# Patient Record
Sex: Female | Born: 1970 | Race: White | Hispanic: Yes | Marital: Married | Smoking: Never smoker
Health system: Southern US, Community
[De-identification: ages and names within clinical notes are randomized; demographics above are authoritative.]

## PROBLEM LIST (undated history)

## (undated) DIAGNOSIS — F32A Depression, unspecified: Secondary | ICD-10-CM

## (undated) DIAGNOSIS — F329 Major depressive disorder, single episode, unspecified: Secondary | ICD-10-CM

## (undated) HISTORY — DX: Depression, unspecified: F32.A

## (undated) HISTORY — PX: BREAST SURGERY: SHX581

## (undated) HISTORY — DX: Major depressive disorder, single episode, unspecified: F32.9

---

## 2006-09-19 ENCOUNTER — Emergency Department (HOSPITAL_COMMUNITY): Admission: EM | Admit: 2006-09-19 | Discharge: 2006-09-20 | Payer: Self-pay | Admitting: Emergency Medicine

## 2006-09-20 ENCOUNTER — Ambulatory Visit (HOSPITAL_COMMUNITY): Admission: RE | Admit: 2006-09-20 | Discharge: 2006-09-20 | Payer: Self-pay | Admitting: Emergency Medicine

## 2006-09-20 ENCOUNTER — Encounter (INDEPENDENT_AMBULATORY_CARE_PROVIDER_SITE_OTHER): Payer: Self-pay | Admitting: Emergency Medicine

## 2006-09-20 ENCOUNTER — Ambulatory Visit: Payer: Self-pay | Admitting: Vascular Surgery

## 2007-04-04 ENCOUNTER — Inpatient Hospital Stay (HOSPITAL_COMMUNITY): Admission: AD | Admit: 2007-04-04 | Discharge: 2007-04-04 | Payer: Self-pay | Admitting: Obstetrics & Gynecology

## 2007-05-06 ENCOUNTER — Ambulatory Visit (HOSPITAL_COMMUNITY): Admission: RE | Admit: 2007-05-06 | Discharge: 2007-05-06 | Payer: Self-pay | Admitting: Obstetrics & Gynecology

## 2007-06-11 ENCOUNTER — Ambulatory Visit (HOSPITAL_COMMUNITY): Admission: RE | Admit: 2007-06-11 | Discharge: 2007-06-11 | Payer: Self-pay | Admitting: Family Medicine

## 2007-07-22 ENCOUNTER — Ambulatory Visit: Payer: Self-pay | Admitting: Obstetrics and Gynecology

## 2007-07-22 ENCOUNTER — Inpatient Hospital Stay (HOSPITAL_COMMUNITY): Admission: AD | Admit: 2007-07-22 | Discharge: 2007-07-24 | Payer: Self-pay | Admitting: Obstetrics & Gynecology

## 2007-07-22 ENCOUNTER — Encounter (INDEPENDENT_AMBULATORY_CARE_PROVIDER_SITE_OTHER): Payer: Self-pay | Admitting: Gynecology

## 2007-07-30 ENCOUNTER — Ambulatory Visit: Payer: Self-pay | Admitting: Advanced Practice Midwife

## 2007-07-30 ENCOUNTER — Inpatient Hospital Stay (HOSPITAL_COMMUNITY): Admission: AD | Admit: 2007-07-30 | Discharge: 2007-07-30 | Payer: Self-pay | Admitting: Obstetrics & Gynecology

## 2008-11-25 IMAGING — US US PELVIS COMPLETE
1 series · 14 of 20 positions shown · non-contrast
Comparison: None available

CLINICAL DATA: POSTPARTUM PAIN

TRANSABDOMINAL ULTRASOUND OF PELVIS
TECHNIQUE: Transabdominal ultrasound examination of the pelvis was
performed including evaluation of the uterus, ovaries, adnexal
regions, and pelvic cul-de-sac.

[Series 1: us pelvis complete · 0.28mm/px · 20 acquisitions, 14 frames shown]
[im 1/20]
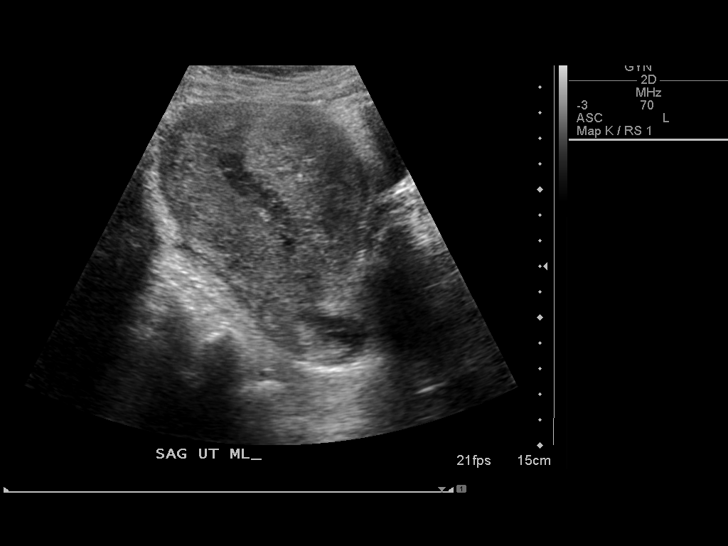
[im 3/20]
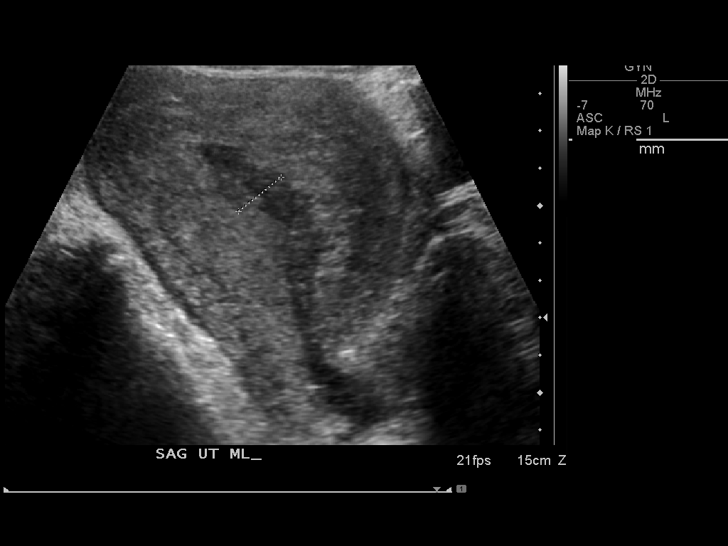
[im 4/20]
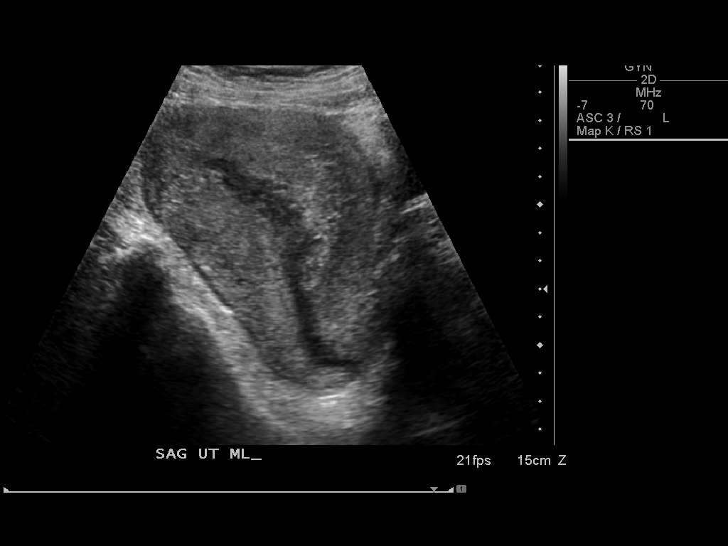
[im 6/20]
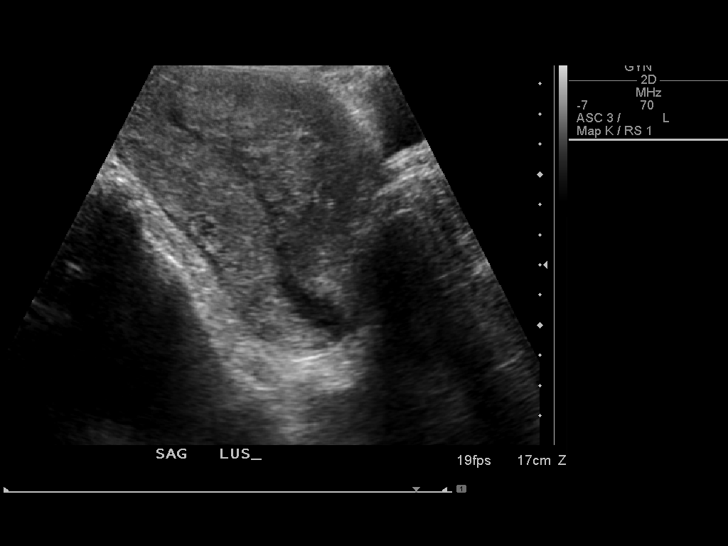
[im 7/20]
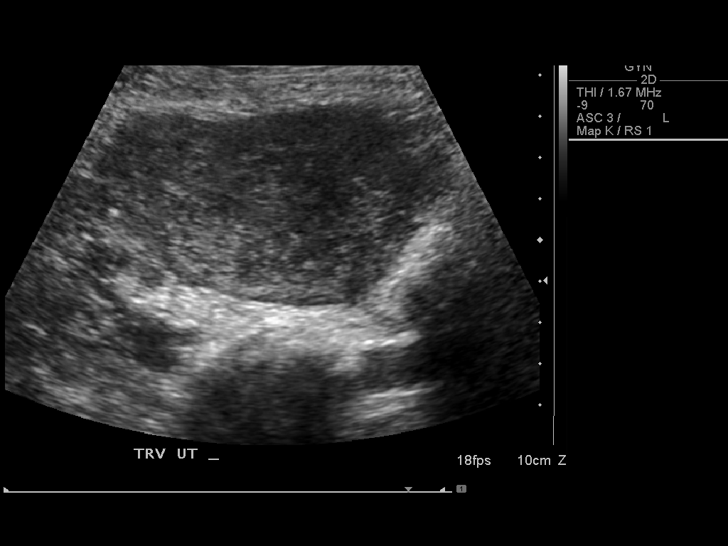
[im 8/20]
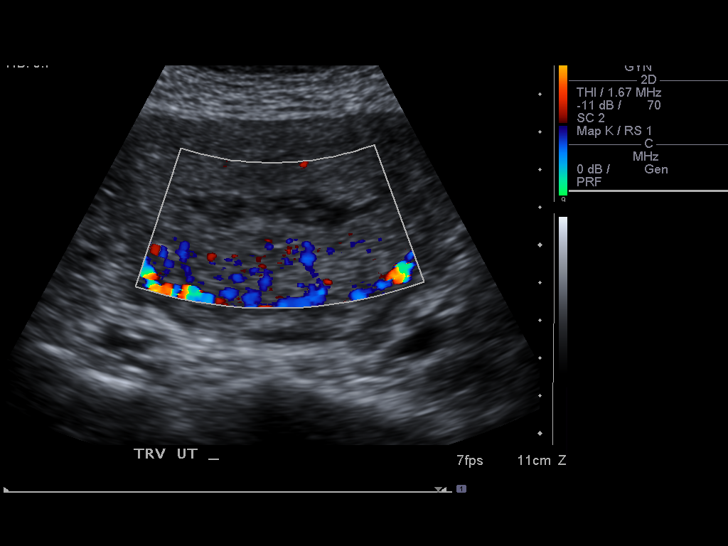
[im 10/20]
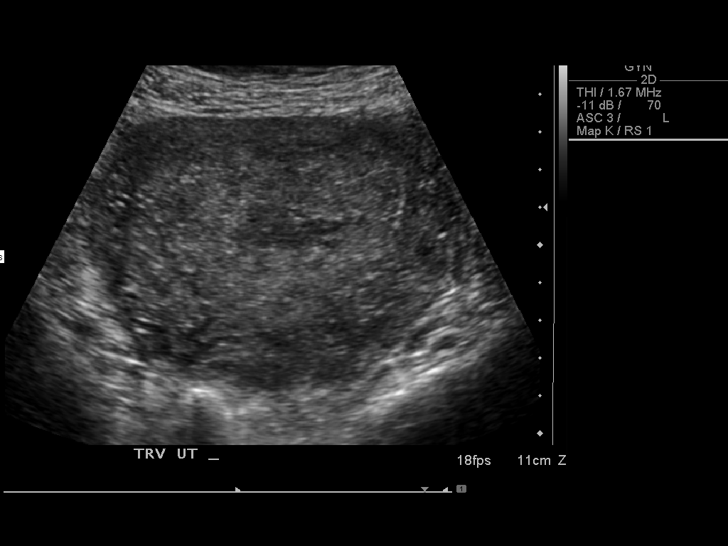
[im 11/20]
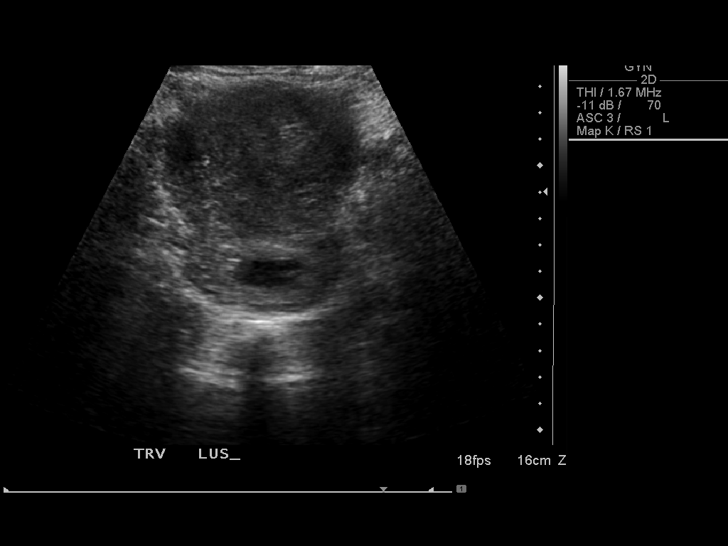
[im 13/20]
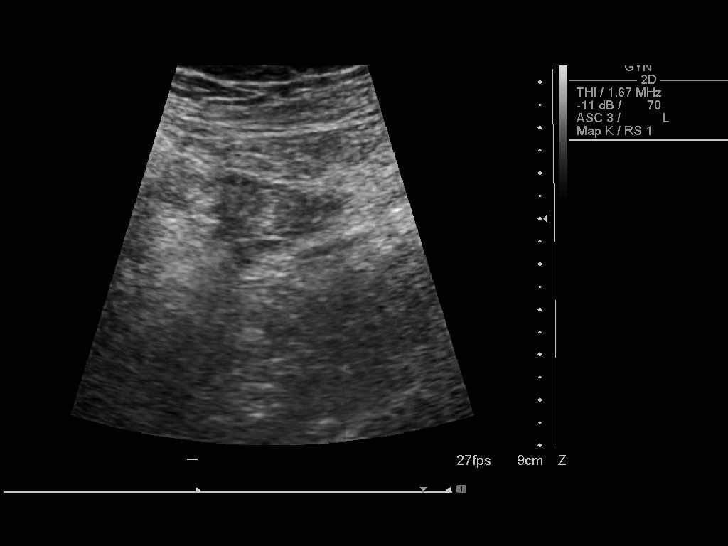
[im 14/20]
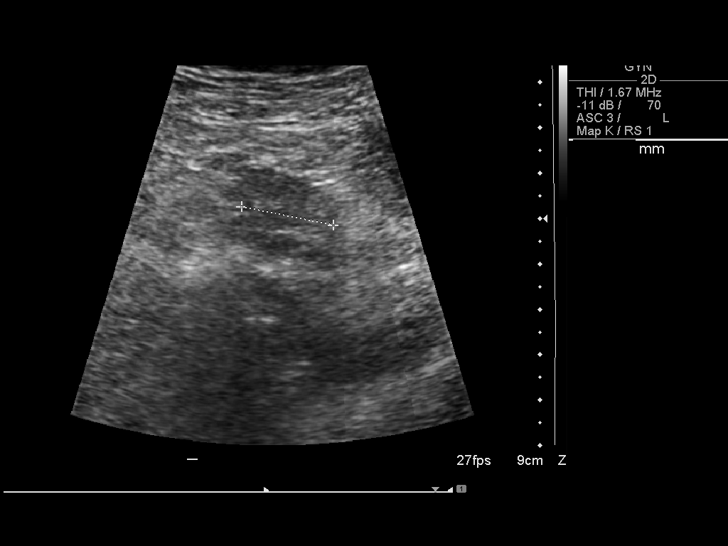
[im 16/20]
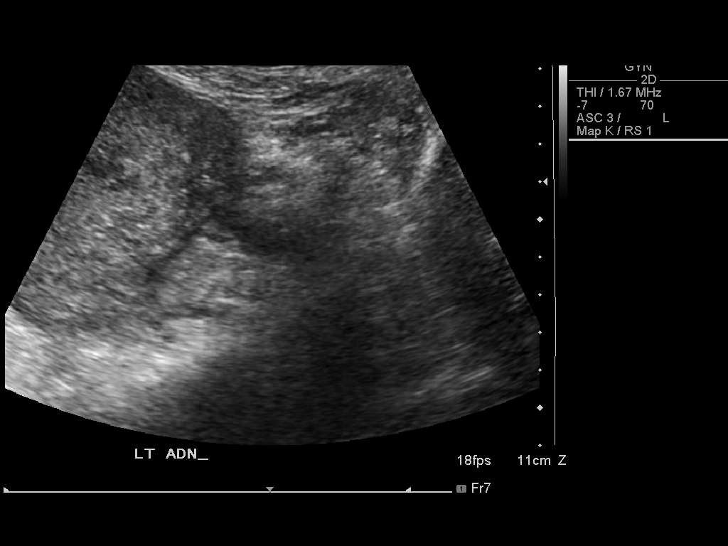
[im 17/20]
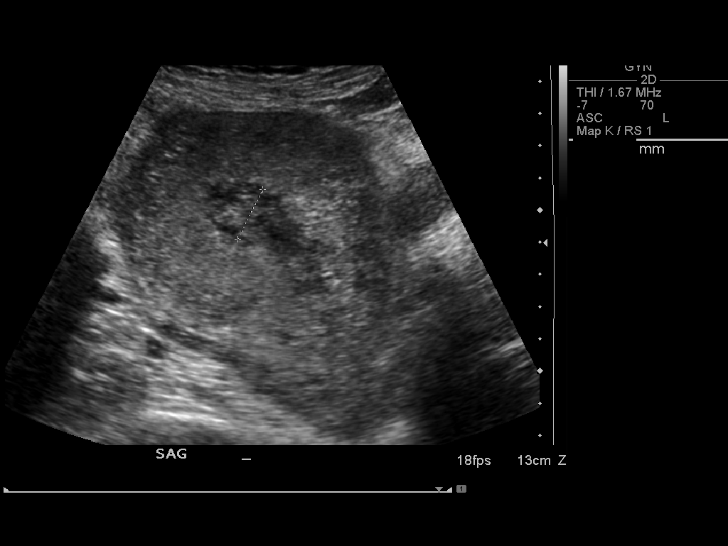
[im 18/20]
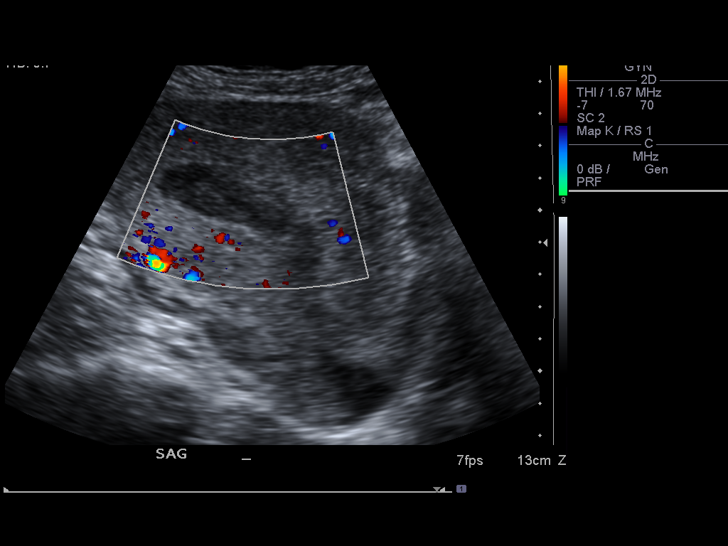
[im 20/20]
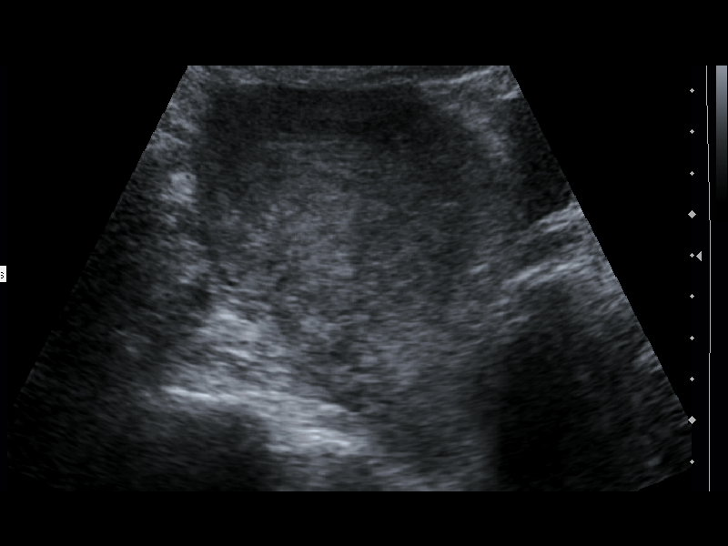

[14 of 20 positions shown; findings below may reference images not displayed]

FINDINGS: Uterus is enlarged measuring 7.3 x 8.9 x 11 cm,
consistent with postpartum status.  The endometrial stripe is
thickened up to 17 mm with complex low level internal echoes.
Right ovary 16 x 20 x 26 mm, unremarkable.  The left ovary is not
visualized.  No free fluid is seen.
IMPRESSION: 1.  Low level internal echoes in the endometrial cavity; cannot
exclude retained products of conception.

## 2011-01-28 LAB — CBC
HCT: 33.7 — ABNORMAL LOW
HCT: 34.8 — ABNORMAL LOW
Hemoglobin: 9.6 — ABNORMAL LOW
MCHC: 33.9
MCV: 98.3
MCV: 98.7
Platelets: 444 — ABNORMAL HIGH
RDW: 13.6
RDW: 13.6
RDW: 13.9
WBC: 12.6 — ABNORMAL HIGH

## 2011-01-28 LAB — DIFFERENTIAL
Eosinophils Absolute: 0.1
Eosinophils Relative: 1
Lymphs Abs: 1.6
Monocytes Absolute: 0.5
Monocytes Relative: 5
Neutrophils Relative %: 81 — ABNORMAL HIGH

## 2011-02-12 LAB — CBC
MCHC: 34.1
MCV: 97.8
RBC: 3.37 — ABNORMAL LOW
RDW: 13.6

## 2011-02-12 LAB — GC/CHLAMYDIA PROBE AMP, GENITAL
Chlamydia, DNA Probe: NEGATIVE
GC Probe Amp, Genital: NEGATIVE

## 2011-02-12 LAB — WET PREP, GENITAL: Yeast Wet Prep HPF POC: NONE SEEN

## 2011-02-12 LAB — URINALYSIS, ROUTINE W REFLEX MICROSCOPIC
Glucose, UA: NEGATIVE
Ketones, ur: NEGATIVE
Nitrite: NEGATIVE

## 2011-02-12 LAB — URINE MICROSCOPIC-ADD ON

## 2013-12-06 ENCOUNTER — Emergency Department (HOSPITAL_COMMUNITY)
Admission: EM | Admit: 2013-12-06 | Discharge: 2013-12-07 | Disposition: A | Discharge status: Home / Self Care | Payer: Self-pay | Attending: Emergency Medicine | Admitting: Emergency Medicine

## 2013-12-06 ENCOUNTER — Emergency Department (HOSPITAL_COMMUNITY): Payer: Self-pay

## 2013-12-06 ENCOUNTER — Encounter (HOSPITAL_COMMUNITY): Payer: Self-pay | Admitting: Emergency Medicine

## 2013-12-06 DIAGNOSIS — Z3202 Encounter for pregnancy test, result negative: Secondary | ICD-10-CM | POA: Insufficient documentation

## 2013-12-06 DIAGNOSIS — R109 Unspecified abdominal pain: Secondary | ICD-10-CM | POA: Insufficient documentation

## 2013-12-06 DIAGNOSIS — N838 Other noninflammatory disorders of ovary, fallopian tube and broad ligament: Secondary | ICD-10-CM

## 2013-12-06 DIAGNOSIS — R1030 Lower abdominal pain, unspecified: Secondary | ICD-10-CM

## 2013-12-06 DIAGNOSIS — N83209 Unspecified ovarian cyst, unspecified side: Secondary | ICD-10-CM | POA: Insufficient documentation

## 2013-12-06 DIAGNOSIS — R110 Nausea: Secondary | ICD-10-CM | POA: Insufficient documentation

## 2013-12-06 LAB — COMPREHENSIVE METABOLIC PANEL
ALK PHOS: 50 U/L (ref 39–117)
ALT: 13 U/L (ref 0–35)
AST: 20 U/L (ref 0–37)
Albumin: 4.2 g/dL (ref 3.5–5.2)
Anion gap: 13 (ref 5–15)
BUN: 14 mg/dL (ref 6–23)
CALCIUM: 9.7 mg/dL (ref 8.4–10.5)
CHLORIDE: 102 meq/L (ref 96–112)
CO2: 23 meq/L (ref 19–32)
Creatinine, Ser: 0.59 mg/dL (ref 0.50–1.10)
GFR calc non Af Amer: >90 mL/min (ref >90)
GLUCOSE: 85 mg/dL (ref 70–99)
POTASSIUM: 3.9 meq/L (ref 3.7–5.3)
Sodium: 138 mEq/L (ref 137–147)
Total Bilirubin: 0.3 mg/dL (ref 0.3–1.2)
Total Protein: 7.7 g/dL (ref 6.0–8.3)

## 2013-12-06 LAB — WET PREP, GENITAL
Clue Cells Wet Prep HPF POC: NONE SEEN
TRICH WET PREP: NONE SEEN
YEAST WET PREP: NONE SEEN

## 2013-12-06 LAB — URINALYSIS, ROUTINE W REFLEX MICROSCOPIC
BILIRUBIN URINE: NEGATIVE
Glucose, UA: NEGATIVE mg/dL
KETONES UR: NEGATIVE mg/dL
Leukocytes, UA: NEGATIVE
NITRITE: NEGATIVE
Protein, ur: NEGATIVE mg/dL
Specific Gravity, Urine: 1.019 (ref 1.005–1.030)
Urobilinogen, UA: 0.2 mg/dL (ref 0.0–1.0)
pH: 5.5 (ref 5.0–8.0)

## 2013-12-06 LAB — CBC
HEMATOCRIT: 38.9 % (ref 36.0–46.0)
HEMOGLOBIN: 12.5 g/dL (ref 12.0–15.0)
MCH: 30.8 pg (ref 26.0–34.0)
MCHC: 32.1 g/dL (ref 30.0–36.0)
MCV: 95.8 fL (ref 78.0–100.0)
Platelets: 275 10*3/uL (ref 150–400)
RBC: 4.06 MIL/uL (ref 3.87–5.11)
RDW: 12.8 % (ref 11.5–15.5)
WBC: 4.9 10*3/uL (ref 4.0–10.5)

## 2013-12-06 LAB — URINE MICROSCOPIC-ADD ON

## 2013-12-06 LAB — POC URINE PREG, ED: Preg Test, Ur: NEGATIVE

## 2013-12-06 MED ORDER — IBUPROFEN 600 MG PO TABS: 600.0000 mg | ORAL_TABLET | Freq: Four times a day (QID) | ORAL | Status: DC | PRN

## 2013-12-06 MED ORDER — AZITHROMYCIN 250 MG PO TABS
1000.0000 mg | ORAL_TABLET | Freq: Once | ORAL | Status: AC
  Administered 2013-12-06: 1000 mg via ORAL
  Filled 2013-12-06: qty 4

## 2013-12-06 MED ORDER — LIDOCAINE HCL 1 % IJ SOLN
INTRAMUSCULAR | Status: AC
  Administered 2013-12-06: 2 mL via INTRAMUSCULAR
  Filled 2013-12-06: qty 20

## 2013-12-06 MED ORDER — CEFTRIAXONE SODIUM 250 MG IJ SOLR
250.0000 mg | Freq: Once | INTRAMUSCULAR | Status: AC
  Administered 2013-12-06: 250 mg via INTRAMUSCULAR
  Filled 2013-12-06: qty 250

## 2013-12-06 MED ORDER — TRAMADOL HCL 50 MG PO TABS: 50.0000 mg | ORAL_TABLET | Freq: Four times a day (QID) | ORAL | Status: DC | PRN

## 2013-12-06 MED ORDER — MORPHINE SULFATE 4 MG/ML IJ SOLN
4.0000 mg | Freq: Once | INTRAMUSCULAR | Status: AC
  Administered 2013-12-06: 4 mg via INTRAVENOUS
  Filled 2013-12-06: qty 1

## 2013-12-06 NOTE — ED Notes (Signed)
Pt to ultrasound

## 2013-12-06 NOTE — ED Provider Notes (Signed)
CSN: [id number]     Arrival date & time 12/06/13  1404 History   First MD Initiated Contact with Patient 12/06/13 1751     Chief Complaint  Patient presents with  . Abdominal Pain  . Flank Pain     (Consider location/radiation/quality/duration/timing/severity/associated sxs/prior Treatment) HPI Comments: Patient's husband helps with translation. Patient and husband comfortable with translation. Pt with no medical hx comes in with abdominal pain. Pain is located in the RLQ, flank region. Pain started 3 weeks ago, and was mild initially - but the last 2 days have been really painful. Pain is now radiating to her groin. Intermittent nausea, with chills. + dysuria, no hematuria, no polyuria. Hx of ovarian cyst on the right side, but she was told it was normal. + hx of UTI, no hx of kidney stones. ROS is also + for 10-15 lbs in 3 months, unintentional.   Patient is a 43 y.o. female presenting with abdominal pain and flank pain. The history is provided by the patient.  Abdominal Pain Associated symptoms: dysuria and nausea   Associated symptoms: no chest pain, no constipation, no cough, no diarrhea, no hematuria, no shortness of breath, no vaginal bleeding, no vaginal discharge and no vomiting   Flank Pain Associated symptoms include abdominal pain. Pertinent negatives include no chest pain and no shortness of breath.    History reviewed. No pertinent past medical history. Past Surgical History  Procedure Laterality Date  . Breast surgery Right    History reviewed. No pertinent family history. History  Substance Use Topics  . Smoking status: Never Smoker   . Smokeless tobacco: Not on file  . Alcohol Use: No   OB History   Grav Para Term Preterm Abortions TAB SAB Ect Mult Living                 Review of Systems  Constitutional: Negative for activity change.  HENT: Negative for facial swelling.   Respiratory: Negative for cough, shortness of breath and wheezing.   Cardiovascular:  Negative for chest pain.  Gastrointestinal: Positive for nausea and abdominal pain. Negative for vomiting, diarrhea, constipation, blood in stool and abdominal distention.  Genitourinary: Positive for dysuria and flank pain. Negative for hematuria, vaginal bleeding, vaginal discharge and difficulty urinating.  Musculoskeletal: Negative for neck pain.  Skin: Negative for color change.  Neurological: Negative for speech difficulty.  Hematological: Does not bruise/bleed easily.  Psychiatric/Behavioral: Negative for confusion.      Allergies  Review of patient's allergies indicates no known allergies.  Home Medications   Prior to Admission medications   Medication Sig Start Date End Date Taking? Authorizing Provider  ibuprofen (ADVIL,MOTRIN) 200 MG tablet Take 400 mg by mouth every 6 (six) hours as needed (pain.).   Yes Historical Provider, MD  ibuprofen (ADVIL,MOTRIN) 600 MG tablet Take 1 tablet (600 mg total) by mouth every 6 (six) hours as needed. 12/06/13   Derwood Kaplan, MD  traMADol (ULTRAM) 50 MG tablet Take 1 tablet (50 mg total) by mouth every 6 (six) hours as needed. 12/06/13   Symantha Steeber Rhunette Croft, MD   BP 112/73  Pulse 65  Temp(Src) 98.2 F (36.8 C) (Oral)  Resp 18  SpO2 100% Physical Exam  Nursing note and vitals reviewed. Constitutional: She is oriented to person, place, and time. She appears well-developed and well-nourished.  HENT:  Head: Normocephalic and atraumatic.  Eyes: Conjunctivae and EOM are normal. Pupils are equal, round, and reactive to light.  Neck: Normal range of motion. Neck supple.  Cardiovascular: Normal rate, regular rhythm, normal heart sounds and intact distal pulses.   No murmur heard. Pulmonary/Chest: Effort normal. No respiratory distress. She has no wheezes.  Abdominal: Soft. Bowel sounds are normal. She exhibits no distension. There is tenderness in the right lower quadrant and suprapubic area. There is CVA tenderness and positive Murphy's sign.  There is no rebound and no guarding.  Genitourinary: Vagina normal and uterus normal.  External exam - normal, no lesions Speculum exam: Pt has some white discharge, no blood Bimanual exam: Patient has no CMT. + right adnexal tenderness  Neurological: She is alert and oriented to person, place, and time.  Skin: Skin is warm and dry.    ED Course  Procedures (including critical care time) Labs Review Labs Reviewed  WET PREP, GENITAL - Abnormal; Notable for the following:    WBC, Wet Prep HPF POC FEW (*)    All other components within normal limits  URINALYSIS, ROUTINE W REFLEX MICROSCOPIC - Abnormal; Notable for the following:    Hgb urine dipstick SMALL (*)    All other components within normal limits  GC/CHLAMYDIA PROBE AMP  CBC  COMPREHENSIVE METABOLIC PANEL  URINE MICROSCOPIC-ADD ON  POC URINE PREG, ED    Imaging Review US Transvaginal Non-ob  12/06/2013   CLINICAL DATA:  Pelvic pain  EXAM: TRANSABDOMINAL AND TRANSVAGINAL ULTRASOUND OF PELVIS  TECHNIQUE: Study was performed transabdominally to optimize pelvic field of view evaluation and transvaginally to optimize internal visceral architecture evaluation.  COMPARISON:  None  FINDINGS: Uterus  Measurements: 7.7 x 3.7 x 4.7 cm. No fibroids or other mass visualized. Uterus is anteverted.  Endometrium  Thickness: 11 mm. No focal abnormality visualized. Endometrial contour is smooth.  Right ovary  Measurements: 3.8 x 2.5 x 3.9 cm. Within the right ovary, there is a complex partially cystic area measuring 2.3 x 1.7 x 2.2 cm. No other right-sided mass is identified.  Left ovary  Measurements: 3.1 x 2.1 x 2.6 cm. Normal appearance/no adnexal mass. Note that there is a dominant follicle measuring 1.3 x 1.0 x 1.2 cm within the left ovary, a physiologic finding.  Other findings  No free fluid.  IMPRESSION: Complex mass in the right ovary measuring 2.3 x 1.7 x 2.2 cm. Suspect hemorrhagic cyst, although an endometrioma could present similarly. An  ovarian neoplasm is a less likely differential consideration. Given this overall appearance, however, short interval follow-up examination advised. Short-interval follow up ultrasound in 6-12 weeks is recommended, preferably during the week following the patient's normal menses.  Study otherwise unremarkable.   Electronically Signed   By: Bretta Bang M.D.   On: 12/06/2013 21:58   US Pelvis Complete  12/06/2013   CLINICAL DATA:  Pelvic pain  EXAM: TRANSABDOMINAL AND TRANSVAGINAL ULTRASOUND OF PELVIS  TECHNIQUE: Study was performed transabdominally to optimize pelvic field of view evaluation and transvaginally to optimize internal visceral architecture evaluation.  COMPARISON:  None  FINDINGS: Uterus  Measurements: 7.7 x 3.7 x 4.7 cm. No fibroids or other mass visualized. Uterus is anteverted.  Endometrium  Thickness: 11 mm. No focal abnormality visualized. Endometrial contour is smooth.  Right ovary  Measurements: 3.8 x 2.5 x 3.9 cm. Within the right ovary, there is a complex partially cystic area measuring 2.3 x 1.7 x 2.2 cm. No other right-sided mass is identified.  Left ovary  Measurements: 3.1 x 2.1 x 2.6 cm. Normal appearance/no adnexal mass. Note that there is a dominant follicle measuring 1.3 x 1.0 x 1.2 cm within the  left ovary, a physiologic finding.  Other findings  No free fluid.  IMPRESSION: Complex mass in the right ovary measuring 2.3 x 1.7 x 2.2 cm. Suspect hemorrhagic cyst, although an endometrioma could present similarly. An ovarian neoplasm is a less likely differential consideration. Given this overall appearance, however, short interval follow-up examination advised. Short-interval follow up ultrasound in 6-12 weeks is recommended, preferably during the week following the patient's normal menses.  Study otherwise unremarkable.   Electronically Signed   By: Bretta BangWilliam  Woodruff M.D.   On: 12/06/2013 21:58     EKG Interpretation None      MDM   Final diagnoses:  Ovarian mass, right   Lower abdominal pain    Pt comes in with cc of abd pain. RLQ abd pain x 3 weeks. Pain worse x 2 days. GU exam revealed right sided adnexal tenderness. All labs are WNL. Pt has some weight loss x 3 months, unintentional.  US shows complex cyst. Results discussed.  [facility] outpatient [facility] info given. Patient informed the importance for close f/u,. Return precautions discussed.   Derwood KaplanAnkit Royal Vandevoort, MD 12/06/13 [phone]

## 2013-12-06 NOTE — ED Notes (Signed)
Pt c/o RLQ pain and R flank pain radiating into R hip/leg and dysuria x 3 weeks.  Pain score 10/10.  Denies n/v/d.  Pt speaks very little [place].  Pt's husband translated during assessment.

## 2013-12-06 NOTE — Discharge Instructions (Signed)
Quiste ovrico (Ovarian Cyst) Un quiste ovrico es una bolsa llena de lquido que se forma en el ovario. Los ovarios son los rganos pequeos que producen vulos en las mujeres. Se pueden formar varios tipos de quistes en los ovarios. La mayora no son cancerosos. Muchos de ellos no causan problemas y con frecuencia desaparecen solos. Algunos pueden provocar sntomas y requerir tratamiento. Los tipos ms comunes de quistes ovricos son los siguientes:  Quistes funcionales: estos quistes pueden aparecer todos los meses durante el ciclo menstrual. Esto es normal. Estos quistes suelen desaparecer con el prximo ciclo menstrual si la mujer no queda embarazada. En general, los quistes funcionales no tienen sntomas.  Endometriomas: estos quistes se forman a partir del tejido que recubre el tero. Tambin se denominan "quistes de chocolate" porque se llenan de sangre que se vuelve marrn. Este tipo de quiste puede provocar dolor en la zona inferior del abdomen durante la relacin sexual y con el perodo menstrual.  Cistoadenomas: este tipo se desarrolla a partir de las clulas que se ubican en el exterior del ovario. Estos quistes pueden ser muy grandes y causar dolor en la zona inferior del abdomen y durante la relacin sexual. Este tipo de quiste puede girar sobre s mismo, cortar el suministro de sangre y causar un dolor intenso. Tambin se puede romper con facilidad y provocar mucho dolor.  Quistes dermoides: este tipo de quiste a veces se encuentra en ambos ovarios. Estos quistes pueden contener diferentes tipos de tejidos del organismo, como piel, dientes, pelo o cartlago. Generalmente no tienen sntomas, a menos que sean muy grandes.  Quistes tecalutenicos: aparecen cuando se produce demasiada cantidad de cierta hormona (gonadotropina corinica humana) que estimula en exceso al ovario para que produzca vulos. Esto es ms frecuente despus de procedimientos que ayudan a la concepcin de un beb  (fertilizacin in vitro). CAUSAS   Los medicamentos para la fertilidad pueden provocar una afeccin mediante la cual se forman mltiples quistes de gran tamao en los ovarios. Esta se denomina sndrome de hiperestimulacin ovrica.  El sndrome del ovario poliqustico es una afeccin que puede causar desequilibrios hormonales, los cuales pueden dar como resultado quistes ovricos no funcionales. SIGNOS Y SNTOMAS  Muchos quistes ovricos no causan sntomas. Si se presentan sntomas, stos pueden ser:  Dolor o molestias en la pelvis.  Dolor en la parte baja del abdomen.  Dolor durante las relaciones sexuales.  Aumento del permetro abdominal (hinchazn).  Perodos menstruales anormales.  Aumento del dolor en los perodos menstruales.  Cese de los perodos menstruales sin estar embarazada. DIAGNSTICO  Estos quistes se descubren comnmente durante un examen de rutina o una exploracin ginecolgica anual. Es posible que se ordenen otros estudios para obtener ms informacin sobre el quiste. Estos estudios pueden ser:  Ecografas.  Radiografas de la pelvis.  Tomografa computada.  Resonancia magntica.  Anlisis de sangre. TRATAMIENTO  Muchos de los quistes ovricos desaparecen por s solos, sin tratamiento. Es probable que el mdico quiera controlar el quiste regularmente durante 2 o 3meses para ver si se produce algn cambio. En el caso de las mujeres en la menopausia, es particularmente importante controlar de cerca al quiste ya que el ndice de cncer de ovario en las mujeres menopusicas es ms alto. Cuando se requiere tratamiento, este puede incluir cualquiera de los siguientes:  Un procedimiento para drenar el quiste (aspiracin). Esto se puede realizar mediante el uso de una aguja grande y una ecografa. Tambin se puede hacer a travs de un procedimiento laparoscpico, En   este procedimiento, se inserta un tubo delgado que emite luz y que tiene una pequea cmara en un  extremo (laparoscopio) a travs de una pequea incisin.  Ciruga para extirpar el quiste completo. Esto se puede realizar mediante una ciruga laparoscpica o una ciruga abierta, la cual implica realizar una incisin ms grande en la parte inferior del abdomen.  Tratamiento hormonal o pldoras anticonceptivas. Estos mtodos a veces se usan para ayudar a disolver un quiste. INSTRUCCIONES PARA EL CUIDADO EN EL HOGAR   Tome solo medicamentos de venta libre o recetados, segn las indicaciones del mdico.  Concurra a las consultas de control con su mdico segn las indicaciones.  Hgase exmenes plvicos regulares y pruebas de Papanicolaou. SOLICITE ATENCIN MDICA SI:   Los perodos se atrasan, son irregulares, dolorosos o cesan.  El dolor plvico o abdominal no desaparece.  El abdomen se agranda o se hincha.  Siente presin en la vejiga o no puede vaciarla completamente.  Siente dolor durante las relaciones sexuales.  Tiene una sensacin de hinchazn, presin o molestias en el estmago.  Pierde peso sin razn aparente.  Siente un malestar generalizado.  Est estreida.  Pierde el apetito.  Le aparece acn.  Nota un aumento del vello corporal y facial.  Aumenta de peso sin hacer modificaciones en su actividad fsica y en su dieta habitual.  Sospecha que est embarazada. SOLICITE ATENCIN MDICA DE INMEDIATO SI:   Siente cada vez ms dolor abdominal.  Tiene malestar estomacal (nuseas) y vomita.  Tiene fiebre que se presenta de manera repentina.  Siente dolor abdominal al defecar.  Sus perodos menstruales son ms abundantes que lo habitual. ASEGRESE DE QUE:   Comprende estas instrucciones.  Controlar su afeccin.  Recibir ayuda de inmediato si no mejora o si empeora. Document Released: 01/30/2005 Document Revised: 04/27/2013 [employer] Patient Information 2015 [employer]. This information is not intended to replace advice given to you by your health  care provider. Make sure you discuss any questions you have with your health care provider.  

## 2013-12-06 NOTE — ED Notes (Signed)
Patient transported to Ultrasound 

## 2013-12-06 NOTE — ED Notes (Signed)
Pt discharged to home with family. NAD att.

## 2013-12-07 LAB — GC/CHLAMYDIA PROBE AMP
CT Probe RNA: NEGATIVE
GC Probe RNA: NEGATIVE

## 2014-01-17 ENCOUNTER — Encounter: Payer: Self-pay | Admitting: Obstetrics & Gynecology

## 2014-01-17 ENCOUNTER — Ambulatory Visit (INDEPENDENT_AMBULATORY_CARE_PROVIDER_SITE_OTHER): Payer: Self-pay | Admitting: Obstetrics & Gynecology

## 2014-01-17 VITALS — BP 107/65 | HR 76 | Temp 98.4°F | Wt 134.9 lb

## 2014-01-17 DIAGNOSIS — N83209 Unspecified ovarian cyst, unspecified side: Secondary | ICD-10-CM

## 2014-01-17 MED ORDER — IBUPROFEN 600 MG PO TABS
600.0000 mg | ORAL_TABLET | Freq: Four times a day (QID) | ORAL | Status: DC | PRN
Start: 2014-01-17 — End: 2023-09-18

## 2014-01-17 NOTE — Progress Notes (Signed)
Subjective:     Patient ID: Denise Cole, female   DOB: November 12, 1970, 43 y.o.   MRN: [id number]  HPI Pt presents for c/o pain on right side of pelvis.  She reports that the pain has improved since she was diagnosed but, she is still requiring pain meds.  Past Medical History  Diagnosis Date  . Depression    Past Surgical History  Procedure Laterality Date  . Breast surgery Right    Current Outpatient Prescriptions on File Prior to Visit  Medication Sig Dispense Refill  . ibuprofen (ADVIL,MOTRIN) 600 MG tablet Take 1 tablet (600 mg total) by mouth every 6 (six) hours as needed.  30 tablet  0  . traMADol (ULTRAM) 50 MG tablet Take 1 tablet (50 mg total) by mouth every 6 (six) hours as needed.  15 tablet  0   No current facility-administered medications on file prior to visit.  No Known Allergies History   Social History  . Marital Status: Single    Spouse Name: N/A    Number of Children: N/A  . Years of Education: N/A   Occupational History  . Not on file.   Social History Main Topics  . Smoking status: Never Smoker   . Smokeless tobacco: Never Used  . Alcohol Use: No  . Drug Use: No  . Sexual Activity: Not Currently    Birth Control/ Protection: None   Other Topics Concern  . Not on file   Social History Narrative  . No narrative on file         Review of Systems     Objective:   Physical Exam BP 107/65  Pulse 76  Temp(Src) 98.4 F (36.9 C) (Oral)  Wt 134 lb 14.4 oz (61.19 kg)  LMP 12/30/2013 Pt in NAD Lungs; CTA CV: RRR Abd; soft, NT; ND GU: EGBUS: no lesions Vagina: no blood in vault Cervix: no lesion; no mucopurulent d/c Uterus: small, mobile Adnexa: no masses; sl tender on right side.      12/08/2013 CLINICAL DATA: Pelvic pain  EXAM:  TRANSABDOMINAL AND TRANSVAGINAL ULTRASOUND OF PELVIS  TECHNIQUE:  Study was performed transabdominally to optimize pelvic field of  view evaluation and transvaginally to optimize internal visceral   architecture evaluation.  COMPARISON: None  FINDINGS:  Uterus  Measurements: 7.7 x 3.7 x 4.7 cm. No fibroids or other mass  visualized. Uterus is anteverted.  Endometrium  Thickness: 11 mm. No focal abnormality visualized. Endometrial  contour is smooth.  Right ovary  Measurements: 3.8 x 2.5 x 3.9 cm. Within the right ovary, there is a  complex partially cystic area measuring 2.3 x 1.7 x 2.2 cm. No other  right-sided mass is identified.  Left ovary  Measurements: 3.1 x 2.1 x 2.6 cm. Normal appearance/no adnexal mass.  Note that there is a dominant follicle measuring 1.3 x 1.0 x 1.2 cm  within the left ovary, a physiologic finding.  Other findings  No free fluid.  IMPRESSION:  Complex mass in the right ovary measuring 2.3 x 1.7 x 2.2 cm.  Suspect hemorrhagic cyst, although an endometrioma could present  similarly. An ovarian neoplasm is a less likely differential  consideration. Given this overall appearance, however, short  interval follow-up examination advised. Short-interval follow up  ultrasound in 6-12 weeks is recommended, preferably during the week  following the patient's normal menses.  Study otherwise unremarkable.   CBC    Component Value Date/Time   WBC 4.9 12/06/2013 1501   RBC 4.06 12/06/2013 1501  HGB 12.5 12/06/2013 1501   HCT 38.9 12/06/2013 1501   PLT 275 12/06/2013 1501   MCV 95.8 12/06/2013 1501   MCH 30.8 12/06/2013 1501   MCHC 32.1 12/06/2013 1501   RDW 12.8 12/06/2013 1501   LYMPHSABS 1.6 07/30/2007 1841   MONOABS 0.5 07/30/2007 1841   EOSABS 0.1 07/30/2007 1841   BASOSABS 0.0 07/30/2007 1841       Assessment:     Right ovarian cyst - suspect hemorrhagic     Plan:     Repeat sono in 4 weeks  F/u 3 months or prn if sono is normal Motrin  1 #30 refill

## 2014-01-17 NOTE — Patient Instructions (Signed)
Quiste ovrico (Ovarian Cyst) Un quiste ovrico es una bolsa llena de lquido que se forma en el ovario. Los ovarios son los rganos pequeos que producen vulos en las mujeres. Se pueden formar varios tipos de quistes en los ovarios. La mayora no son cancerosos. Muchos de ellos no causan problemas y con frecuencia desaparecen solos. Algunos pueden provocar sntomas y requerir tratamiento. Los tipos ms comunes de quistes ovricos son los siguientes:  Quistes funcionales: estos quistes pueden aparecer todos los meses durante el ciclo menstrual. Esto es normal. Estos quistes suelen desaparecer con el prximo ciclo menstrual si la mujer no queda embarazada. En general, los quistes funcionales no tienen sntomas.  Endometriomas: estos quistes se forman a partir del tejido que recubre el tero. Tambin se denominan "quistes de chocolate" porque se llenan de sangre que se vuelve marrn. Este tipo de quiste puede provocar dolor en la zona inferior del abdomen durante la relacin sexual y con el perodo menstrual.  Cistoadenomas: este tipo se desarrolla a partir de las clulas que se ubican en el exterior del ovario. Estos quistes pueden ser muy grandes y causar dolor en la zona inferior del abdomen y durante la relacin sexual. Este tipo de quiste puede girar sobre s mismo, cortar el suministro de sangre y causar un dolor intenso. Tambin se puede romper con facilidad y provocar mucho dolor.  Quistes dermoides: este tipo de quiste a veces se encuentra en ambos ovarios. Estos quistes pueden contener diferentes tipos de tejidos del organismo, como piel, dientes, pelo o cartlago. Generalmente no tienen sntomas, a menos que sean muy grandes.  Quistes tecalutenicos: aparecen cuando se produce demasiada cantidad de cierta hormona (gonadotropina corinica humana) que estimula en exceso al ovario para que produzca vulos. Esto es ms frecuente despus de procedimientos que ayudan a la concepcin de un beb  (fertilizacin in vitro). CAUSAS   Los medicamentos para la fertilidad pueden provocar una afeccin mediante la cual se forman mltiples quistes de gran tamao en los ovarios. Esta se denomina sndrome de hiperestimulacin ovrica.  El sndrome del ovario poliqustico es una afeccin que puede causar desequilibrios hormonales, los cuales pueden dar como resultado quistes ovricos no funcionales. SIGNOS Y SNTOMAS  Muchos quistes ovricos no causan sntomas. Si se presentan sntomas, stos pueden ser:  Dolor o molestias en la pelvis.  Dolor en la parte baja del abdomen.  Dolor durante las relaciones sexuales.  Aumento del permetro abdominal (hinchazn).  Perodos menstruales anormales.  Aumento del dolor en los perodos menstruales.  Cese de los perodos menstruales sin estar embarazada. DIAGNSTICO  Estos quistes se descubren comnmente durante un examen de rutina o una exploracin ginecolgica anual. Es posible que se ordenen otros estudios para obtener ms informacin sobre el quiste. Estos estudios pueden ser:  Ecografas.  Radiografas de la pelvis.  Tomografa computada.  Resonancia magntica.  Anlisis de sangre. TRATAMIENTO  Muchos de los quistes ovricos desaparecen por s solos, sin tratamiento. Es probable que el mdico quiera controlar el quiste regularmente durante 2 o 3meses para ver si se produce algn cambio. En el caso de las mujeres en la menopausia, es particularmente importante controlar de cerca al quiste ya que el ndice de cncer de ovario en las mujeres menopusicas es ms alto. Cuando se requiere tratamiento, este puede incluir cualquiera de los siguientes:  Un procedimiento para drenar el quiste (aspiracin). Esto se puede realizar mediante el uso de una aguja grande y una ecografa. Tambin se puede hacer a travs de un procedimiento laparoscpico, En   este procedimiento, se inserta un tubo delgado que emite luz y que tiene una pequea cmara en un  extremo (laparoscopio) a travs de una pequea incisin.  Ciruga para extirpar el quiste completo. Esto se puede realizar mediante una ciruga laparoscpica o una ciruga abierta, la cual implica realizar una incisin ms grande en la parte inferior del abdomen.  Tratamiento hormonal o pldoras anticonceptivas. Estos mtodos a veces se usan para ayudar a disolver un quiste. INSTRUCCIONES PARA EL CUIDADO EN EL HOGAR   Tome solo medicamentos de venta libre o recetados, segn las indicaciones del mdico.  Concurra a las consultas de control con su mdico segn las indicaciones.  Hgase exmenes plvicos regulares y pruebas de Papanicolaou. SOLICITE ATENCIN MDICA SI:   Los perodos se atrasan, son irregulares, dolorosos o cesan.  El dolor plvico o abdominal no desaparece.  El abdomen se agranda o se hincha.  Siente presin en la vejiga o no puede vaciarla completamente.  Siente dolor durante las relaciones sexuales.  Tiene una sensacin de hinchazn, presin o molestias en el estmago.  Pierde peso sin razn aparente.  Siente un malestar generalizado.  Est estreida.  Pierde el apetito.  Le aparece acn.  Nota un aumento del vello corporal y facial.  Aumenta de peso sin hacer modificaciones en su actividad fsica y en su dieta habitual.  Sospecha que est embarazada. SOLICITE ATENCIN MDICA DE INMEDIATO SI:   Siente cada vez ms dolor abdominal.  Tiene malestar estomacal (nuseas) y vomita.  Tiene fiebre que se presenta de manera repentina.  Siente dolor abdominal al defecar.  Sus perodos menstruales son ms abundantes que lo habitual. ASEGRESE DE QUE:   Comprende estas instrucciones.  Controlar su afeccin.  Recibir ayuda de inmediato si no mejora o si empeora. Document Released: 01/30/2005 Document Revised: 04/27/2013 ExitCare Patient Information 2015 Exit[employer]. This information is not intended to replace advice given to you by your health  care provider. Make sure you discuss any questions you have with your health care provider.  

## 2014-02-14 ENCOUNTER — Ambulatory Visit (HOSPITAL_COMMUNITY): Admission: RE | Admit: 2014-02-14 | Payer: Self-pay | Source: Ambulatory Visit

## 2014-03-07 ENCOUNTER — Encounter: Payer: Self-pay | Admitting: Obstetrics & Gynecology

## 2015-04-04 IMAGING — US US PELVIS COMPLETE
1 series · 13 of 25 positions shown · non-contrast
Comparison: None

CLINICAL DATA: Pelvic pain

EXAM:
TRANSABDOMINAL AND TRANSVAGINAL ULTRASOUND OF PELVIS
TECHNIQUE: Study was performed transabdominally to optimize pelvic field of
view evaluation and transvaginally to optimize internal visceral
architecture evaluation.

[Series 1: us pelvis complete · 0.20mm/px · 13 of 63 slices shown]
[im 1/63]
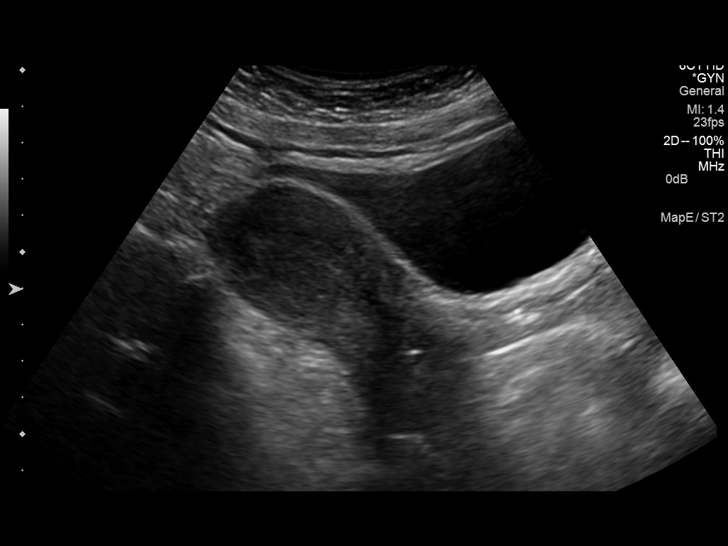
[im 6/63]
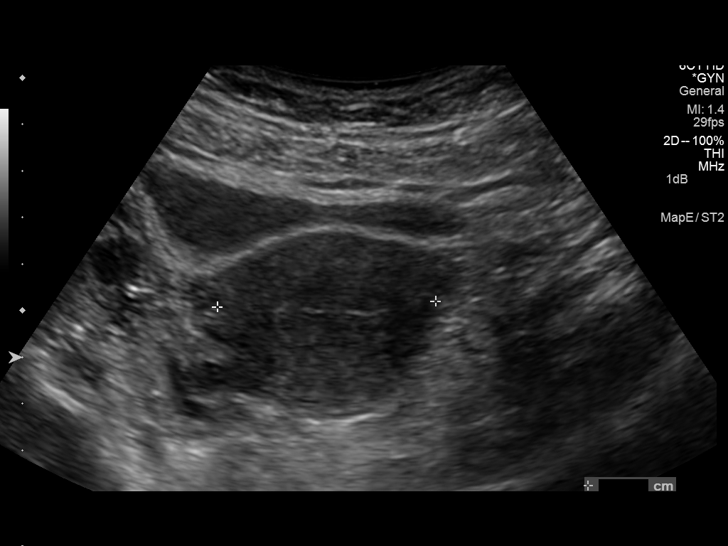
[im 11/63]
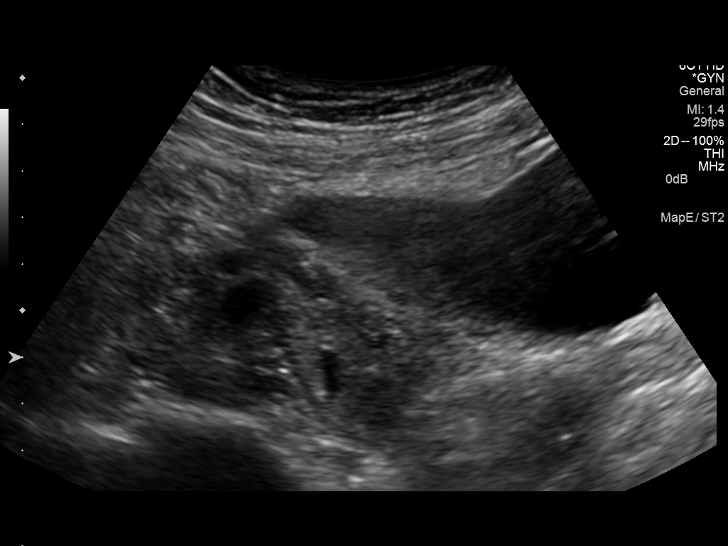
[im 16/63]
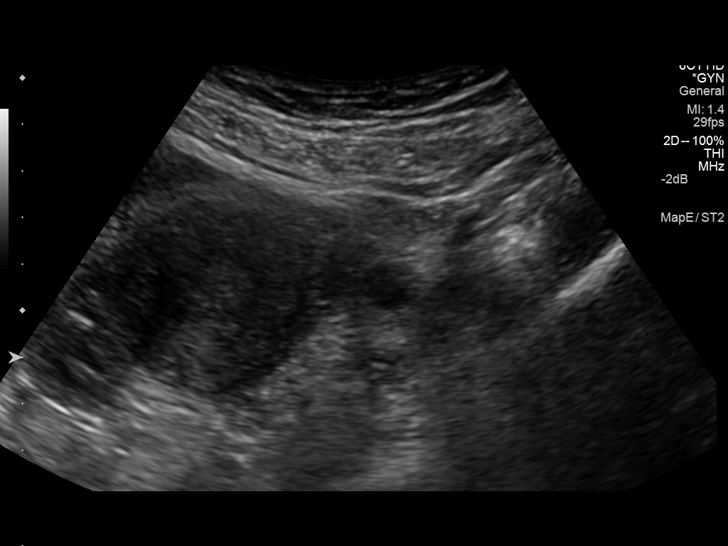
[im 21/63]
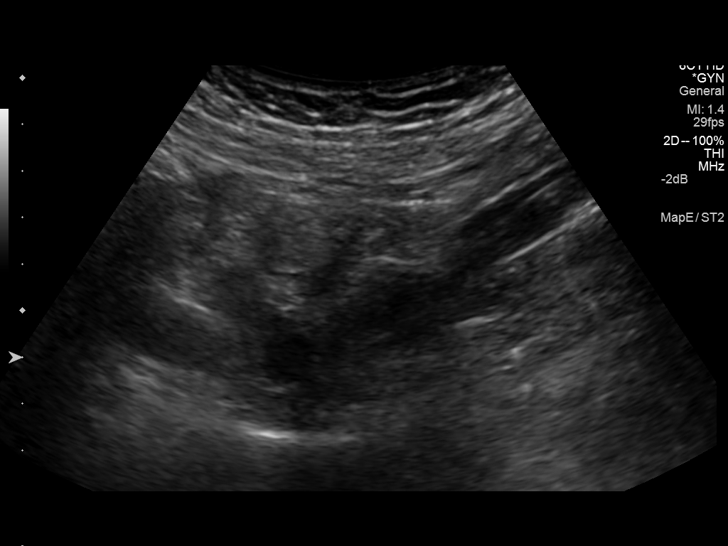
[im 26/63]
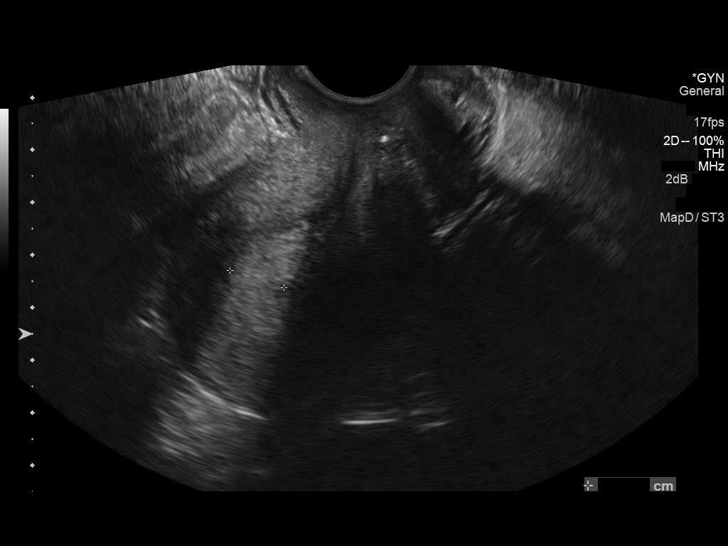
[im 32/63]
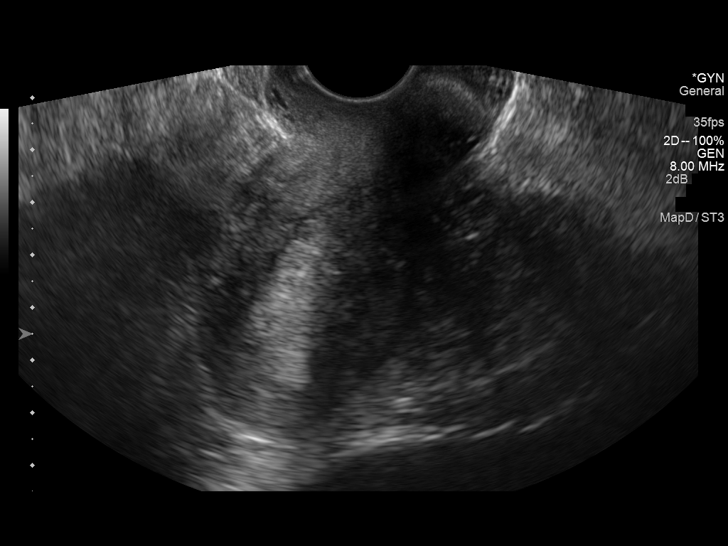
[im 37/63]
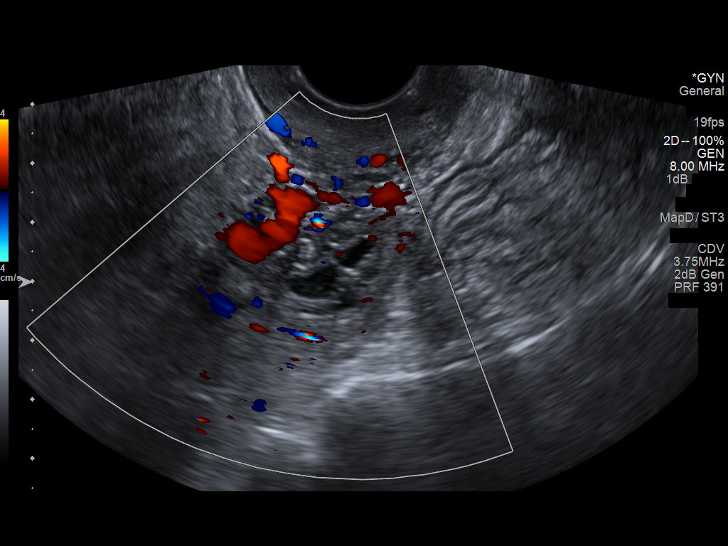
[im 42/63]
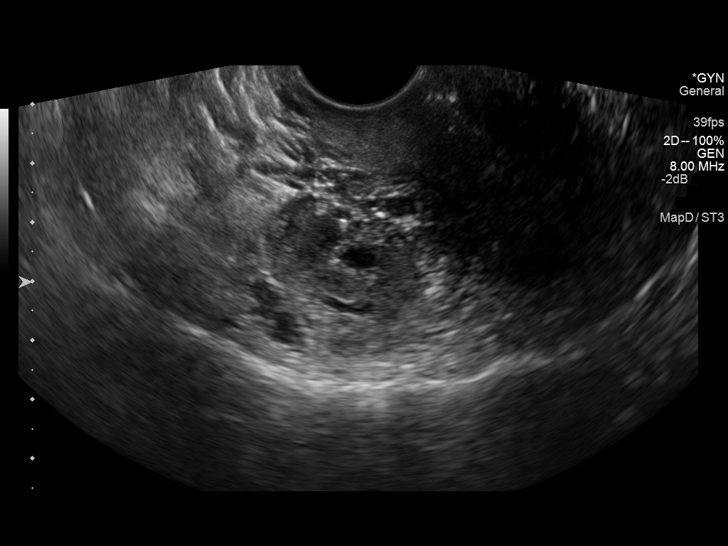
[im 47/63]
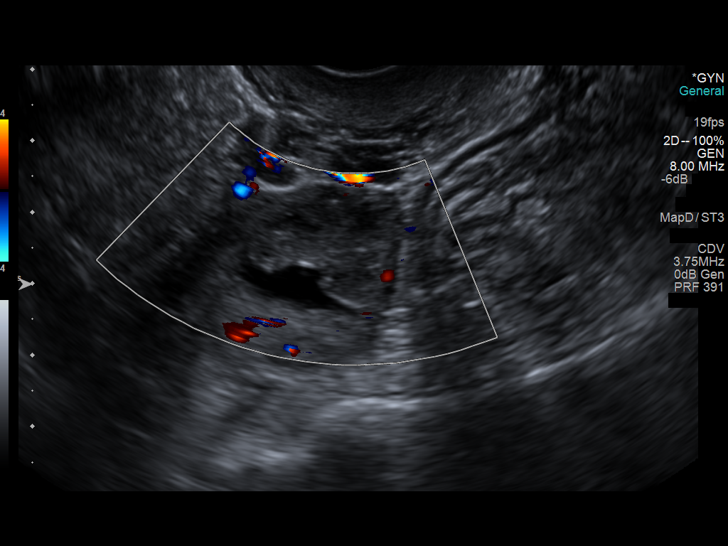
[im 52/63]
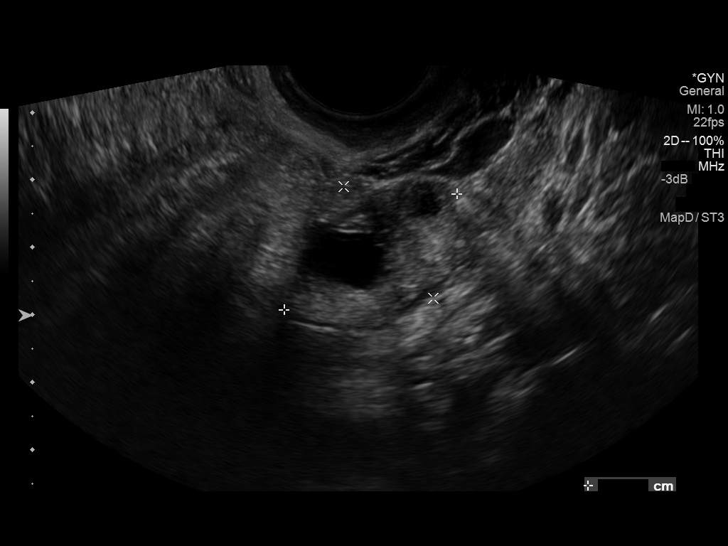
[im 57/63]
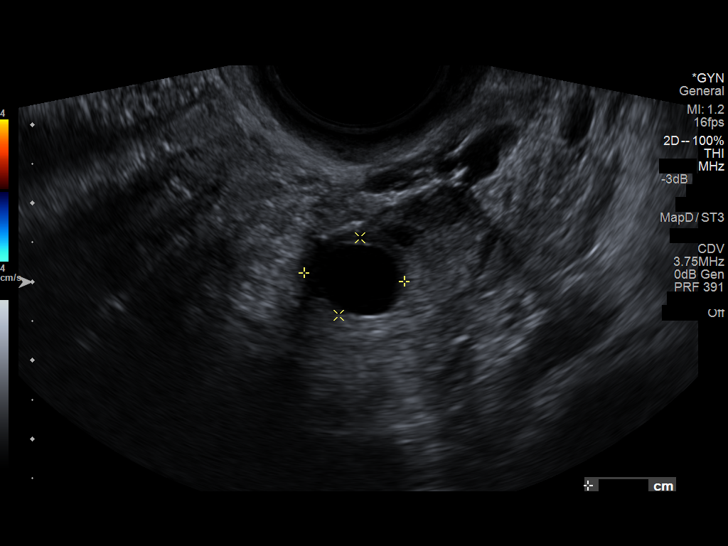
[im 63/63]
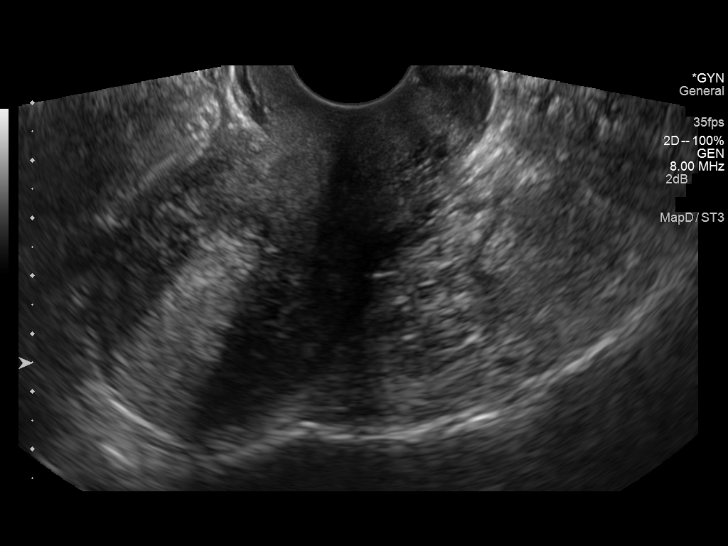

[13 of 25 positions shown; findings below may reference images not displayed]

FINDINGS: Uterus

Measurements: 7.7 x 3.7 x 4.7 cm. No fibroids or other mass
visualized. Uterus is anteverted.

Endometrium

Thickness: 11 mm. No focal abnormality visualized. Endometrial
contour is smooth.

Right ovary

Measurements: 3.8 x 2.5 x 3.9 cm. Within the right ovary, there is a
complex partially cystic area measuring 2.3 x 1.7 x 2.2 cm. No other
right-sided mass is identified.

Left ovary

Measurements: 3.1 x 2.1 x 2.6 cm. Normal appearance/no adnexal mass.
Note that there is a dominant follicle measuring 1.3 x 1.0 x 1.2 cm
within the left ovary, a physiologic finding.

Other findings

No free fluid.
IMPRESSION: Complex mass in the right ovary measuring 2.3 x 1.7 x 2.2 cm.
Suspect hemorrhagic cyst, although an endometrioma could present
similarly. An ovarian neoplasm is a less likely differential
consideration. Given this overall appearance, however, short
interval follow-up examination advised. Short-interval follow up
ultrasound in 6-12 weeks is recommended, preferably during the week
following the patient's normal menses.

Study otherwise unremarkable.

## 2022-01-23 ENCOUNTER — Other Ambulatory Visit: Payer: Self-pay | Admitting: Nurse Practitioner

## 2022-01-23 ENCOUNTER — Ambulatory Visit
Admission: RE | Admit: 2022-01-23 | Discharge: 2022-01-23 | Disposition: A | Discharge status: Home / Self Care | Payer: No Typology Code available for payment source | Source: Ambulatory Visit | Attending: Nurse Practitioner | Admitting: Nurse Practitioner

## 2022-01-23 DIAGNOSIS — R2232 Localized swelling, mass and lump, left upper limb: Secondary | ICD-10-CM

## 2023-09-01 ENCOUNTER — Encounter: Payer: Self-pay | Admitting: Family Medicine

## 2023-09-18 ENCOUNTER — Encounter: Payer: Self-pay | Admitting: Gastroenterology

## 2023-09-18 ENCOUNTER — Ambulatory Visit (INDEPENDENT_AMBULATORY_CARE_PROVIDER_SITE_OTHER): Payer: Self-pay | Admitting: Gastroenterology

## 2023-09-18 ENCOUNTER — Other Ambulatory Visit (INDEPENDENT_AMBULATORY_CARE_PROVIDER_SITE_OTHER): Payer: Self-pay

## 2023-09-18 VITALS — BP 110/60 | HR 70 | Ht 62.0 in | Wt 159.0 lb

## 2023-09-18 DIAGNOSIS — Z8619 Personal history of other infectious and parasitic diseases: Secondary | ICD-10-CM

## 2023-09-18 DIAGNOSIS — R748 Abnormal levels of other serum enzymes: Secondary | ICD-10-CM

## 2023-09-18 DIAGNOSIS — F1090 Alcohol use, unspecified, uncomplicated: Secondary | ICD-10-CM

## 2023-09-18 DIAGNOSIS — R7989 Other specified abnormal findings of blood chemistry: Secondary | ICD-10-CM

## 2023-09-18 DIAGNOSIS — K760 Fatty (change of) liver, not elsewhere classified: Secondary | ICD-10-CM

## 2023-09-18 LAB — PROTIME-INR
INR: 1 ratio (ref 0.8–1.0)
Prothrombin Time: 10.3 s (ref 9.6–13.1)

## 2023-09-18 LAB — IBC + FERRITIN
Ferritin: 52.1 ng/mL (ref 10.0–291.0)
Iron: 87 ug/dL (ref 42–145)
Saturation Ratios: 15.4 % — ABNORMAL LOW (ref 20.0–50.0)
TIBC: 564.2 ug/dL — ABNORMAL HIGH (ref 250.0–450.0)
Transferrin: 403 mg/dL — ABNORMAL HIGH (ref 212.0–360.0)

## 2023-09-18 NOTE — Progress Notes (Signed)
 Dottie Santalucia [id number] 1970/11/10   Chief Complaint: Fatty liver  Referring Provider: Azalia Bolk* Primary GI MD: Para Bold  HPI: Denise Cole is a 53 y.o. female, new patient, with past medical history of hypercholesterolemia and hypertriglyceridemia, hypercalcemia, H. pylori infection, elevated LFTs who is referred to us  for further evaluation of her hepatic steatosis.    Patient seen by PCP 08/27/2023 [facility]), reported liver steatosis diagnosed in [place] and is referred to us  for further evaluation.  Labs 01/01/2021: Normal CBC, bilirubin 0.3, alk phos 77, AST 33, ALT 56, hypercholesterolemia, hypertriglyceridemia, normal TSH, negative hep C antibody  Labs 07/08/2023: Normal TSH, H. pylori breath test positive  Labs 08/27/2023: Bilirubin 0.3, alk phos 152, AST 29, ALT 46, GGT 54, low vitamin D 9.5  Patient speaks Spanish and an interpreter is here for the visit. Patient states she is unsure why she is here today, but she was seeing an endocrinologist and that she was here to discuss her thyroid given recent findings of hypercalcemia.   States she received a diagnosis of fatty liver by ultrasound last December in [place].  Ultrasound was performed due to patient's symptoms of upper abdominal pain and fatty liver was an incidental finding.  She had a positive H. pylori breath test in March, states that this was treated and her abdominal pain has resolved.  She is no longer taking a PPI, is not having acid reflux or heartburn.  States her PCP is planning for repeat H. pylori testing to confirm eradication.  Patient denies dysphagia, nausea, vomiting.  She has a bowel movement twice daily and denies constipation, diarrhea, blood in stool, or melena.  She denies any history of heavy alcohol use or smoking.  Denies family history of liver disease.  She denies any prior method of colon cancer screening.  Denies family history of colon cancer or  stomach cancer.  She is not interested in any form of colon cancer screening at this time but would like to receive more information and think about it.   Previous GI Procedures   None   Past Medical History:  Diagnosis Date   Depression     Past Surgical History:  Procedure Laterality Date   BREAST SURGERY Right     Current Outpatient Medications  Medication Sig Dispense Refill   ibuprofen  (ADVIL ,MOTRIN ) 600 MG tablet Take 1 tablet (600 mg total) by mouth every 6 (six) hours as needed for moderate pain. Take with food 60 tablet 1   traMADol  (ULTRAM ) 50 MG tablet Take 1 tablet (50 mg total) by mouth every 6 (six) hours as needed. 15 tablet 0   No current facility-administered medications for this visit.    Allergies as of 09/18/2023   (No Known Allergies)    Family History  Problem Relation Age of Onset   Heart disease Mother    Heart disease Father     Social History   Tobacco Use   Smoking status: Never   Smokeless tobacco: Never  Substance Use Topics   Alcohol use: No   Drug use: No     Review of Systems:    Constitutional: No weight loss, fever, chills, weakness or fatigue Eyes: No change in vision Ears, Nose, Throat:  No change in hearing or congestion Skin: No rash or itching Cardiovascular: No chest pain, chest pressure or palpitations   Respiratory: No SOB or cough Gastrointestinal: See HPI and otherwise negative Genitourinary: No dysuria or change in urinary frequency Neurological: No headache, dizziness  or syncope Musculoskeletal: No new muscle or joint pain Hematologic: No bleeding or bruising    Physical Exam:  Vital signs: BP 110/60   Pulse 70   Ht 5\' 2"  (1.575 m)   Wt 159 lb (72.1 kg)   SpO2 97%   BMI 29.08 kg/m    Constitutional: NAD, Well developed, Well nourished, alert and cooperative Head:  Normocephalic and atraumatic.  Eyes: No scleral icterus. Conjunctiva pink. Mouth: No oral lesions. Respiratory: Respirations even and  unlabored. Lungs clear to auscultation bilaterally.  No wheezes, crackles, or rhonchi.  Cardiovascular:  Regular rate and rhythm. No murmurs. No peripheral edema. Gastrointestinal:  Soft, nondistended, nontender. No rebound or guarding. Normal bowel sounds. No appreciable masses or hepatomegaly. Rectal:  Not performed.  Neurologic:  Alert and oriented x4;  grossly normal neurologically.  Skin:   Dry and intact without significant lesions or rashes. Psychiatric: Oriented to person, place and time. Demonstrates good judgement and reason without abnormal affect or behaviors.   RELEVANT LABS AND IMAGING: CBC    Component Value Date/Time   WBC 4.9 12/06/2013 1501   RBC 4.06 12/06/2013 1501   HGB 12.5 12/06/2013 1501   HCT 38.9 12/06/2013 1501   PLT 275 12/06/2013 1501   MCV 95.8 12/06/2013 1501   MCH 30.8 12/06/2013 1501   MCHC 32.1 12/06/2013 1501   RDW 12.8 12/06/2013 1501   LYMPHSABS 1.6 07/30/2007 1841   MONOABS 0.5 07/30/2007 1841   EOSABS 0.1 07/30/2007 1841   BASOSABS 0.0 07/30/2007 1841    CMP     Component Value Date/Time   NA 138 12/06/2013 1501   K 3.9 12/06/2013 1501   CL 102 12/06/2013 1501   CO2 23 12/06/2013 1501   GLUCOSE 85 12/06/2013 1501   BUN 14 12/06/2013 1501   CREATININE 0.59 12/06/2013 1501   CALCIUM 9.7 12/06/2013 1501   PROT 7.7 12/06/2013 1501   ALBUMIN 4.2 12/06/2013 1501   AST 20 12/06/2013 1501   ALT 13 12/06/2013 1501   ALKPHOS 50 12/06/2013 1501   BILITOT 0.3 12/06/2013 1501   GFRNONAA >90 12/06/2013 1501   GFRAA >90 12/06/2013 1501     Assessment/Plan:   Hepatic steatosis (diagnosed by ultrasound in [place])  Elevated liver enzymes Patient with diagnosis of fatty liver seen on ultrasound in [place], done in December per patient.  Has had elevated liver enzymes with alk phos 152, AST 29, ALT 46, T. bili 0.3.  Denies any GI symptoms since completing treatment for H. pylori. - FIB-4 score 0.71, approximate fibrosis stage 0-1, advanced  fibrosis excluded - Will order updated RUQ US  - Previously had negative Hep C Ab - Check labs today: Hep B surface antigen, Hep B core antibody, iron panel, ferritin, IgG, ANA, ASMA, alpha-one-antitrypsin level, TTG, IgA - Continue to abstain from all alcohol including beer, wine, liquor, and non-alcoholic beer.  - Work to maintain a health weight through portion control and exercise   - Maximize control of hyperlipidemia  History of H. pylori infection Positive H. pylori breath test in March.  Per patient this has been treated by her PCP and she has had complete resolution of symptoms.  Eradication study has not yet been performed but states that she is planning to have this done with her primary care. - Encouraged patient to follow through with eradication study.  If this has not been completed by the time we see her for follow-up, we can do this in our office.  Colon cancer screening Patient is  due for colon cancer screening.  No prior colonoscopy or other form of screening.  We discussed the prevalence of colon cancer and the importance of screening, including option for Cologuard test as alternative to colonoscopy.  Patient would like more time to consider her options. - We will give information regarding colon cancer and screening options, patient will think about what she would like to do and we will revisit at follow up.   Valiant Gaul, PA-C [facility] 09/18/2023, 8:01 AM  Patient Care Team: Pcp, No as PCP - General

## 2023-09-18 NOTE — Patient Instructions (Addendum)
 Su proveedor le ha solicitado que baje al stano para anlisis de laboratorio antes de irse hoy. Presione "B" en el ascensor. El laboratorio se encuentra en la primera puerta a la izquierda al salir del Medical sales representative.  Le han programado una ecografa abdominal en [facility] (primer piso del hospital) el 23/5/25 a las 9:00 a. m. Por favor, llegue 15 minutos antes de su cita para registrarse. Asegrese de no comer ni beber nada 6 horas antes de su cita. Si necesita reprogramar su cita, comunquese con radiologa al [phone]. Esta prueba suele durar unos 30 minutos.  Debido a los cambios recientes en las [address] sanitarias, es posible que vea los [place] de sus estudios de imagen y Interior and spatial designer en MyChart antes de que su proveedor haya tenido la oportunidad de revisarlos. Entendemos que, en algunos casos, puede haber [employer] resulten confusos o preocupantes. No todos los resultados de laboratorio se entregan en el mismo plazo y el proveedor podra estar esperando varios resultados para Administrator, Civil Service. Por favor, denos 48 horas para que su proveedor revise a fondo [employer] antes de contactarnos para aclararlos.  Gracias por elegirme a m y a Adult nurse Gastroenterology.  Valiant Gaul, PA    Pruebas de deteccin de cncer colorrectal Colorectal Cancer Screening  Las pruebas de deteccin de cncer colorrectal se usan para confirmar o descartar este tipo de cncer antes de que se desarrollen los sntomas. Colorrectal se refiere al colon y al recto. El colon y el recto se encuentran al final del tubo digestivo y all se eliminan las deposiciones (heces) hacia fuera del cuerpo. Quin debe realizarse la prueba de deteccin? Todos los adultos que tengan entre 45 y 75 aos de edad deben someterse a las pruebas de Airline pilot. El mdico puede recomendar la realizacin de pruebas de deteccin antes de los 45 aos de Laura. Le realizarn pruebas cada 1 a 10 aos, segn los resultados y  el tipo de prueba de deteccin. Las recomendaciones de pruebas de deteccin para los adultos que tienen entre 76 y 85 aos de edad varan segn la salud de Dealer. Las [employer] de 85 aos ya no deberan someterse a las pruebas de Pharmacist, hospital. Usted puede realizarse pruebas de deteccin antes de los 45 aos o con ms frecuencia que otras personas si tiene alguno de estos factores de riesgo: Antecedentes personales o familiares de cncer colorrectal o crecimientos anormales, llamados plipos, en el colon. Una enfermedad inflamatoria del intestino, como colitis ulcerosa o enfermedad de Crohn. Antecedentes de radioterapia en el abdomen o el rea que est [employer] de la cadera (rea plvica) para tratamiento de cncer. Un tipo de sndrome gentico que se transmite de padres a hijos (hereditario), como: Sndrome de Personnel officer. Poliposis adenomatosa familiar. Sndrome de [place]. Sndrome de Peutz-Jeghers. Poliposis asociada a MUTYH (MAP). Antecedentes personales de diabetes. Tipos de pruebas Hay diversos tipos de pruebas de deteccin de cncer colorrectal. Puede tener una o ms de las siguientes cosas: Prueba de sangre oculta en la materia fecal con guayacol. Para realizar esta prueba, se examina Ona Bidding de heces a fin de Engineer, manufacturing sangre escondida (oculta), lo que podra ser un signo de Building services engineer. Prueba inmunoqumica fecal (PIF). Para realizar esta prueba, se examina Ona Bidding de heces a fin de detectar la presencia de [place], lo que podra ser un signo de Building services engineer. Prueba de cido desoxirribonucleico (ADN) en heces. Para realizar esta prueba, se examina Ona Bidding de heces a fin de  detectar la presencia de sangre y cambios en el ADN que podran Facilities manager. Sigmoidoscopa. Durante esta prueba, se utiliza un tubo flexible y delgado con una cmara en el extremo, llamado sigmoidoscopio, para examinar el recto y la parte inferior  del colon. Colonoscopa. Durante esta prueba, se utiliza un tubo largo y flexible con una cmara en el extremo, llamado colonoscopio, para examinar todo el colon y el recto. Adems, a veces se toma una muestra de tejido para examinarla con microscopio (biopsia) o se extraen plipos pequeos durante esta prueba. Colonoscopa virtual. En lugar de un colonoscopio, en este tipo de colonoscopa se utiliza una exploracin por tomografa computarizada (TC) para tomar imgenes del colon y el recto. Una TC es un tipo de radiografa que se realiza utilizando computadoras. Cules son los beneficios de las pruebas de deteccin? Las pruebas de deteccin reducen el riesgo de cncer colorrectal y pueden ayudar a identificar el cncer en una etapa temprana, cuando se puede extirpar o tratar con mayor facilidad. Es comn que se formen plipos en el revestimiento del colon, especialmente a medida que envejece. Estos plipos pueden ser cancerosos o volverse cancerosos con el tiempo. Las pruebas de deteccin pueden identificar estos plipos. Cules son los riesgos de la prueba de deteccin? En general, son pruebas seguras. Sin embargo, pueden ocurrir Advice worker, por ejemplo: La necesidad de realizarse ms pruebas para [employer] de [place] prueba de [place] de [place]. Las pruebas de muestras de heces tienen menos riesgos que otros tipos de pruebas de Airline pilot. Estar expuesto a niveles bajos de radiacin, si le hicieron una prueba en la que se utilizan rayos X. Esto puede aumentar ligeramente el riesgo de tener cncer. El beneficio de Architectural technologist cncer supera el ligero aumento del Conrad. Hemorragia, dao en el intestino o infeccin causada por una sigmoidoscopa o colonoscopa. Reaccin a los medicamentos administrados durante una sigmoidoscopa o colonoscopa. Consulte a su mdico para comprender su riesgo de Public relations account executive y para elaborar un plan de deteccin que sea adecuado para  usted. Preguntas para hacerle al mdico Cundo debo comenzar con las pruebas de deteccin del cncer colorrectal? Cul es mi riesgo de Warehouse manager cncer colorrectal? Con qu frecuencia tengo que realizarme pruebas de deteccin? Qu pruebas de deteccin tengo que realizarme? Cmo obtengo los resultados de la prueba? Qu significan los resultados? Dnde buscar ms informacin Obtenga ms informacin sobre las pruebas de deteccin del Building services engineer en los siguientes sitios: Curator Society (Asociacin Estadounidense Water quality scientist): cancer.org [employer] (Instituto Nacional del Cncer): cancer.gov Resumen Las pruebas de deteccin de Building services engineer se usan para Astronomer o Engineer, agricultural tipo de cncer antes de que se desarrollen los sntomas. Todos los adultos que tengan entre 45 y 75 aos de edad deben someterse a las pruebas de Airline pilot. El mdico puede recomendar la realizacin de pruebas de deteccin antes de los 45 aos de [place]. Usted puede realizarse pruebas de deteccin antes de los 45 aos o con ms frecuencia que otras personas si tiene ciertos factores de [place]. Las pruebas de deteccin reducen el riesgo de cncer colorrectal y pueden ayudar a identificar el cncer en una etapa temprana, cuando se puede extirpar o tratar con mayor facilidad. Consulte a su mdico para comprender su riesgo de Public relations account executive y para elaborar un plan de deteccin que sea adecuado para usted. Esta informacin no tiene Theme park manager el consejo del mdico. Asegrese de hacerle al mdico cualquier pregunta que tenga. Document Revised: 10/13/2019 Document  Reviewed: 10/13/2019 Elsevier Patient Education  2024 [employer].

## 2023-09-20 NOTE — Progress Notes (Signed)
 Attending Physician's Attestation   I have reviewed the chart.   I agree with the Advanced Practitioner's note, impression, and recommendations with any updates as below. At some point if LFTs remain abnormal can consider role of liver biopsy.  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  Agree with workup as outlined for rule out of other potential etiologies.  If possible to add-on CPK level that will be helpful as well.   Yong Henle, MD [facility] Advanced [facility]oscopy Office # [phone]

## 2023-09-21 LAB — ANTI-SMOOTH MUSCLE ANTIBODY, IGG: Actin (Smooth Muscle) Antibody (IGG): <20 U (ref <20)

## 2023-09-21 LAB — HEPATITIS B SURFACE ANTIGEN: Hepatitis B Surface Ag: NONREACTIVE

## 2023-09-21 LAB — ALPHA-1-ANTITRYPSIN: A-1 Antitrypsin, Ser: 144 mg/dL (ref 83–199)

## 2023-09-21 LAB — TISSUE TRANSGLUTAMINASE, IGA: (tTG) Ab, IgA: <1 U/mL

## 2023-09-21 LAB — ANA: Anti Nuclear Antibody (ANA): POSITIVE — AB

## 2023-09-21 LAB — HEPATITIS B SURFACE ANTIBODY,QUALITATIVE: Hep B S Ab: NONREACTIVE

## 2023-09-21 LAB — ANTI-NUCLEAR AB-TITER (ANA TITER): ANA Titer 1: 1:40 {titer} — ABNORMAL HIGH

## 2023-09-21 LAB — IGA: Immunoglobulin A: 346 mg/dL — ABNORMAL HIGH (ref 47–310)

## 2023-09-22 ENCOUNTER — Telehealth: Payer: Self-pay

## 2023-09-22 DIAGNOSIS — R748 Abnormal levels of other serum enzymes: Secondary | ICD-10-CM

## 2023-09-22 NOTE — Telephone Encounter (Signed)
-----   Message from Sharmaine Dearth sent at 09/22/2023 10:26 AM EDT ----- Regarding: Add on lab Please see if a CPK can be added on to patient's recent labs. If not, we can get this done at follow up. Thanks! ----- Message ----- From: Normie Becton., MD Sent: 09/20/2023   7:17 AM EDT To: Sharmaine Dearth, PA-C     ----- Message ----- From: Floreen Hunger Sent: 09/18/2023  11:42 AM EDT To: Normie Becton., MD

## 2023-09-22 NOTE — Telephone Encounter (Signed)
 Lab order has been entered- message left with pt to return call using interpreter

## 2023-09-23 NOTE — Telephone Encounter (Signed)
Left message on machine to call back using interpreter service  

## 2023-09-24 ENCOUNTER — Ambulatory Visit: Payer: Self-pay | Admitting: Gastroenterology

## 2023-09-24 NOTE — Telephone Encounter (Signed)
 Attempted to reach pt by phone using interpreter x 3. I have not received a response- letter mailed

## 2023-09-26 ENCOUNTER — Ambulatory Visit (HOSPITAL_COMMUNITY): Payer: Self-pay

## 2023-12-04 ENCOUNTER — Ambulatory Visit: Payer: Self-pay | Admitting: Internal Medicine

## 2023-12-04 NOTE — Progress Notes (Deleted)
    Name: Denise Cole  MRN/ DOB: [id number], 1970-07-08    Age/ Sex: 53 y.o., female    PCP: Pcp, No   Reason for Endocrinology Evaluation: Hyperparathyroidism     Date of Initial Endocrinology Evaluation: 12/04/2023     HPI: [name] is a 53 y.o. female with a past medical history of ***. The patient presented for initial endocrinology clinic visit on 12/04/2023 for consultative assistance with her hyperparathyroidism.   Patient has been referred here for further evaluation of hyperparathyroidism, in reviewing medical records she had a PTH level of 57 (reference 15-65 PG/mL) on 4/20 07/2023, vitamin D was very low at 9.5 NG/mL, serum calcium elevated at 11.4 Mg/dL (corrected 89.39) with elevated alkaline phosphatase level of 152 international unit /L  Patient has been following up with GI due to history of hepatic steatosis  HISTORY:  Past Medical History:  Past Medical History:  Diagnosis Date   Depression    Past Surgical History:  Past Surgical History:  Procedure Laterality Date   BREAST SURGERY Right     Social History:  reports that she has never smoked. She has never used smokeless tobacco. She reports that she does not drink alcohol and does not use drugs. Family History: family history includes Heart disease in her father and mother.   HOME MEDICATIONS: Allergies as of 12/04/2023   No Known Allergies      Medication List        Accurate as of December 04, 2023  7:25 AM. If you have any questions, ask your nurse or doctor.          venlafaxine XR 37.5 MG 24 hr capsule Commonly known as: EFFEXOR-XR Take 37.5 mg by mouth daily.          REVIEW OF SYSTEMS: A comprehensive ROS was conducted with the patient and is negative except as per HPI and below:  ROS     OBJECTIVE:  VS: There were no vitals taken for this visit.   Wt Readings from Last 3 Encounters:  09/18/23 159 lb (72.1 kg)  01/17/14 134 lb 14.4 oz (61.2 kg)      EXAM: General: Pt appears well and is in NAD  Eyes: External eye exam normal without stare, lid lag or exophthalmos.  EOM intact.  PERRL.  Neck: General: Supple without adenopathy. Thyroid: Thyroid size normal.  No goiter or nodules appreciated. No thyroid bruit.  Lungs: Clear with good BS bilat   Heart: Auscultation: RRR.  Abdomen: Soft, nontender  Extremities:  BL LE: No pretibial edema   Mental Status: Judgment, insight: Intact Orientation: Oriented to time, place, and person Mood and affect: No depression, anxiety, or agitation     DATA REVIEWED: ***    ASSESSMENT/PLAN/RECOMMENDATIONS:   Hyperparathyroidism:      Medications :  Signed electronically by: Stefano Redgie Butts, MD  [facility] Endocrinology  [facility] [address] [place] [place], [place] [address] Phone: [phone] FAX: [phone]   CC: Pcp, No No address on file Phone: None Fax: None   Return to Endocrinology clinic as below: Future Appointments  Date Time Provider Department Center  12/04/2023 10:10 AM Labrittany Wechter, Donell Redgie, MD [id number] None

## 2024-05-26 ENCOUNTER — Other Ambulatory Visit: Payer: Self-pay

## 2024-05-26 ENCOUNTER — Emergency Department (HOSPITAL_COMMUNITY)
Admission: EM | Admit: 2024-05-26 | Discharge: 2024-05-26 | Disposition: A | Discharge status: Home / Self Care | Payer: Worker's Compensation | Attending: Emergency Medicine | Admitting: Emergency Medicine

## 2024-05-26 ENCOUNTER — Encounter (HOSPITAL_COMMUNITY): Payer: Self-pay

## 2024-05-26 ENCOUNTER — Emergency Department (HOSPITAL_COMMUNITY): Payer: Worker's Compensation

## 2024-05-26 DIAGNOSIS — Y92009 Unspecified place in unspecified non-institutional (private) residence as the place of occurrence of the external cause: Secondary | ICD-10-CM | POA: Diagnosis not present

## 2024-05-26 DIAGNOSIS — W172XXA Fall into hole, initial encounter: Secondary | ICD-10-CM | POA: Insufficient documentation

## 2024-05-26 DIAGNOSIS — S83422A Sprain of lateral collateral ligament of left knee, initial encounter: Secondary | ICD-10-CM | POA: Insufficient documentation

## 2024-05-26 DIAGNOSIS — M25562 Pain in left knee: Secondary | ICD-10-CM | POA: Diagnosis present

## 2024-05-26 DIAGNOSIS — W19XXXA Unspecified fall, initial encounter: Secondary | ICD-10-CM

## 2024-05-26 MED ORDER — IBUPROFEN 400 MG PO TABS
600.0000 mg | ORAL_TABLET | Freq: Once | ORAL | Status: AC
  Administered 2024-05-26: 600 mg via ORAL
  Filled 2024-05-26: qty 1

## 2024-05-26 NOTE — ED Provider Notes (Signed)
" °  [place] EMERGENCY DEPARTMENT AT [facility] Provider Note   CSN: [id number] Arrival date & time: 05/26/24  1221     Patient presents with: Denise Cole is a 54 y.o. female.  {Add pertinent medical, surgical, social history, OB history to YEP:67052}  Fall       Prior to Admission medications  Medication Sig Start Date End Date Taking? Authorizing Provider  venlafaxine XR (EFFEXOR-XR) 37.5 MG 24 hr capsule Take 37.5 mg by mouth daily. 08/27/23   [provider]    Allergies: Patient has no known allergies.    Review of Systems  Updated Vital Signs BP 113/67   Pulse 82   Temp 98.2 F (36.8 C)   Resp 18   SpO2 97%   Physical Exam  (all labs ordered are listed, but only abnormal results are displayed) Labs Reviewed - No data to display  EKG: None  Radiology: DG Lumbar Spine Complete Result Date: 05/26/2024 CLINICAL DATA:  Back pain status post fall EXAM: LUMBAR SPINE - COMPLETE 4+ VIEW COMPARISON:  None available FINDINGS: Alignment within normal limits. Vertebral body heights maintained. Mild disc height loss at L5-S1. Mild facet degenerative changes seen at L4-L5 and L5-S1. Mild diffuse osteopenia is present. IMPRESSION: 1. No acute radiographic abnormality of the lumbar spine. 2. Mild degenerative changes of the lower lumbar spine. Electronically Signed   By: Aliene Lloyd M.D.   On: 05/26/2024 14:04   DG Ankle Complete Left Result Date: 05/26/2024 CLINICAL DATA:  LEFT ankle pain status post fall. EXAM: LEFT ANKLE COMPLETE - 3+ VIEW COMPARISON:  None available FINDINGS: No acute osseous abnormalities. Joint spaces are maintained. Soft tissues are within normal limits. IMPRESSION: No acute radiographic abnormality of the LEFT knee. Electronically Signed   By: Aliene Lloyd M.D.   On: 05/26/2024 14:03   DG Knee Complete 4 Views Left Result Date: 05/26/2024 CLINICAL DATA:  LEFT knee pain status post fall EXAM: LEFT KNEE - COMPLETE  4+ VIEW COMPARISON:  None available FINDINGS: No acute osseous abnormalities. Soft tissues within normal limits. Joint spaces are maintained. IMPRESSION: Normal radiographic appearance of the LEFT knee. Electronically Signed   By: Aliene Lloyd M.D.   On: 05/26/2024 14:02    {Document cardiac monitor, telemetry assessment procedure when appropriate:32947} Procedures   Medications Ordered in the ED - No data to display    {Click here for ABCD2, HEART and other calculators REFRESH Note before signing:1}                              Medical Decision Making  ***  {Document critical care time when appropriate  Document review of labs and clinical decision tools ie CHADS2VASC2, etc  Document your independent review of radiology images and any outside records  Document your discussion with family members, caretakers and with consultants  Document social determinants of health affecting pt's care  Document your decision making why or why not admission, treatments were needed:32947:::1}   Final diagnoses:  None    ED Discharge Orders     None        "

## 2024-05-26 NOTE — ED Provider Triage Note (Signed)
 Emergency Medicine Provider Triage Evaluation Note  Denise Cole , a 54 y.o. female  was evaluated in triage.  Pt complains of  Fall last night.  Pain in the left knee lumbar spine, and ankle.  Taking Tylenol without relief unable to ambulate Review of Systems  Positive: fall Negative: weakness  Physical Exam  BP 113/67   Pulse 82   Temp 98.2 F (36.8 C)   Resp 18   SpO2 97%  Gen:   Awake, no distress   Resp:  Normal effort  MSK:   Moves extremities without difficulty  Other:  Left knee effusion  Medical Decision Making  Medically screening exam initiated at 1:13 PM.  Appropriate orders placed.  Mecca Guitron was informed that the remainder of the evaluation will be completed by another provider, this initial triage assessment does not replace that evaluation, and the importance of remaining in the ED until their evaluation is complete.     Arloa Chroman, PA-C 05/26/24 1316

## 2024-05-26 NOTE — Progress Notes (Signed)
 Orthopedic Tech Progress Note Patient Details:  Denise Cole July 14, 1970 [id number]  Ortho Devices Type of Ortho Device: Crutches, Knee Immobilizer Ortho Device/Splint Location: LLE Ortho Device/Splint Interventions: Ordered, Application, Adjustment   Post Interventions Patient Tolerated: Well Instructions Provided: Care of device, Adjustment of device  [name] 05/26/2024, 4:10 PM

## 2024-05-26 NOTE — Discharge Instructions (Signed)
 You were seen in the emergency department after a fall.  You seem to have a sprain of your left knee.  Your x-rays do not show fracture.  You should wear the immobilizer while you are active and you can take it off when you sleep at night.  Use ice for 10 minutes every 1-2 hours while awake.  Elevate your leg while at rest.  Do not bear weight if it hurts to do so.  Please follow-up with orthopedics as soon as possible.

## 2024-05-26 NOTE — ED Triage Notes (Addendum)
 Pt arrives via POV. PT requests that husband translates.  Pt tripped and fell in a hole in the floor at home. PT c/o left knee pain, back pain, and left ankle. She is AxOx4.
